# Patient Record
Sex: Male | Born: 1940 | Race: White | Hispanic: No | Marital: Married | Smoking: Former smoker
Health system: Southern US, Community
[De-identification: ages and names within clinical notes are randomized; demographics above are authoritative.]

## PROBLEM LIST (undated history)

## (undated) DIAGNOSIS — I10 Essential (primary) hypertension: Secondary | ICD-10-CM

---

## 2020-09-05 ENCOUNTER — Other Ambulatory Visit: Payer: Self-pay

## 2020-09-05 ENCOUNTER — Encounter: Payer: Self-pay | Admitting: Emergency Medicine

## 2020-09-05 ENCOUNTER — Ambulatory Visit (INDEPENDENT_AMBULATORY_CARE_PROVIDER_SITE_OTHER): Payer: No Typology Code available for payment source | Admitting: Emergency Medicine

## 2020-09-05 VITALS — BP 112/72 | HR 68 | Temp 97.7°F | Ht 72.0 in | Wt 210.0 lb

## 2020-09-05 DIAGNOSIS — R9389 Abnormal findings on diagnostic imaging of other specified body structures: Secondary | ICD-10-CM | POA: Diagnosis not present

## 2020-09-05 DIAGNOSIS — R911 Solitary pulmonary nodule: Secondary | ICD-10-CM

## 2020-09-05 NOTE — Patient Instructions (Signed)
We will repeat your CT scan of the chest in August 2022 to follow for stability or interval change compared with your [facility]. Follow Dr. Delton Coombes in August after your CT scan so that we can review the results together.  Depending on the results we will decide what work-up needs to take place next.

## 2020-09-05 NOTE — Assessment & Plan Note (Signed)
He has scattered pulmonary nodules that are stable by serial CT chest over a 1 year interval, lived in the [facility], question histo although no calcification.  There is an evolving mid right lower lobe opacity, now more nodular and 8 mm that has evolved over multiple CTs.  Discussed possible causes with him today.  His tobacco exposure is minimal but he may have been exposed to asbestos.  Does not describe any significant family history.  For now we will plan to repeat his CT chest in 6 months to follow for interval stability, August 2022.  If enlarging then we will expand work-up, consider PET scan versus navigational bronchoscopy.  If we believe he is a candidate for primary resection we will obtain pulmonary function testing

## 2020-09-05 NOTE — Progress Notes (Signed)
Subjective:    Patient ID: Justin Myers, male    DOB: 02/22/1941, 80 y.o.   MRN: [id number]  HPI 80 year old man, smoker (10 pack years) with a history of hypertension, hyperlipidemia, gout, prostate cancer s/p TURP less than a year ago, glaucoma.  He is referred today from the [place] for an abnormal CT scan of the chest  He apparently had a CT abdomen for gallstones 2 yrs ago, found a RLL lesion that has been followed with serial imaging.   [facility] CT scans of the chest available dated.  Reviewed by me.  This shows scattered pulmonary nodules including an anterior right lower lobe nodule that is 4 x 6.6 mm, stable.  There is an area of slightly dilated right lower lobe midlung airway that looks dilated, now has a more solid appearance with nodularity, 8 mm.  This was read as an increasing area of dilated and mucus filled subsegmental bronchus in the peripheral anterior segment of the right lower lobe.  Also noted was an indeterminate right hilar node that was increased in size but was still within normal size limits.    Review of Systems As  Per HPI  PMH: Hypertension Hyperlipidemia Prostate cancer Gout Glaucoma  No family history on file.   Social History   Socioeconomic History  . Marital status: Married    Spouse name: Not on file  . Number of children: Not on file  . Years of education: Not on file  . Highest education level: Not on file  Occupational History  . Not on file  Tobacco Use  . Smoking status: Current Every Day Smoker    Packs/day: 0.50    Years: 9.00    Pack years: 4.50    Types: Cigarettes  . Smokeless tobacco: Never Used  Substance and Sexual Activity  . Alcohol use: Not on file  . Drug use: Not on file  . Sexual activity: Not on file  Other Topics Concern  . Not on file  Social History Narrative  . 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 allopurinol (ZYLOPRIM) 300 MG tablet Take 0.5 tablets by mouth daily.    Marland Kitchen amLODipine (NORVASC) 5 MG tablet TAKE ONE TABLET BY MOUTH DAILY FOR BLOOD PRESSURE.    Marland Kitchen ascorbic acid (VITAMIN C) 100 MG tablet Take by mouth.    Marland Kitchen aspirin 81 MG EC tablet Take by mouth.    . Brinzolamide-Brimonidine 1-0.2 % SUSP INSTILL 1 DROP IN LEFT EYE THREE TIMES A DAY    . latanoprost (XALATAN) 0.005 % ophthalmic solution INSTILL 1 DROP IN BOTH EYES AT BEDTIME    . lisinopril (ZESTRIL) 40 MG tablet TAKE ONE TABLET BY MOUTH DAILY FOR BLOOD PRESSURE    . pravastatin (PRAVACHOL) 10 MG tablet TAKE ONE TABLET BY MOUTH AT BEDTIME FOR CHOLESTEROL     No facility-administered medications prior to visit.         Objective:   Physical  Exam Vitals:   09/05/20 1059  BP: 112/72  Pulse: 68  Temp: 97.7 F (36.5 C)  TempSrc: Temporal  SpO2: 97%  Weight: 210 lb (95.3 kg)  Height: 6' (1.829 m)   Gen: Pleasant, well-nourished, in no distress,  normal affect  ENT: No lesions,  mouth clear,  oropharynx clear, no postnasal drip  Neck: No JVD, no stridor  Lungs: No use of accessory muscles, no crackles or wheezing on normal respiration, no wheeze on forced expiration  Cardiovascular: RRR, heart sounds normal, no murmur or gallops, no peripheral edema  Musculoskeletal: No deformities, no cyanosis or clubbing  Neuro: alert, awake, non focal  Skin: Warm, no lesions or rash      Assessment & Plan:  Abnormal CT of the chest He has scattered pulmonary nodules that are stable by serial CT chest over a 1 year  interval, lived in the [facility], question histo although no calcification.  There is an evolving mid right lower lobe opacity, now more nodular and 8 mm that has evolved over multiple CTs.  Discussed possible causes with him today.  His tobacco exposure is minimal but he may have been exposed to asbestos.  Does not describe any significant family history.  For now we will plan to repeat his CT chest in 6 months to follow for interval stability, August 2022.  If enlarging then we will expand work-up, consider PET scan versus navigational bronchoscopy.  If we believe he is a candidate for primary resection we will obtain pulmonary function testing  Levy Pupa, MD, PhD 09/05/2020, 1:29 PM [facility] [phone] or if no answer before 7:00PM call [phone] For any issues after 7:00PM please call eLink [phone]

## 2020-09-14 ENCOUNTER — Telehealth: Payer: Self-pay | Admitting: Emergency Medicine

## 2020-09-14 NOTE — Telephone Encounter (Signed)
Called Justin Myers but she did not answer. Left her a detailed message stating that I would fax a copy of the OV notes from 03/22 to her. OV notes have been faxed.   Nothing further needed at time of call.

## 2021-01-23 ENCOUNTER — Ambulatory Visit (HOSPITAL_COMMUNITY)
Admission: RE | Admit: 2021-01-23 | Discharge: 2021-01-23 | Disposition: A | Discharge status: Home / Self Care | Payer: No Typology Code available for payment source | Source: Ambulatory Visit | Attending: Emergency Medicine | Admitting: Emergency Medicine

## 2021-01-23 ENCOUNTER — Other Ambulatory Visit: Payer: Self-pay

## 2021-01-23 DIAGNOSIS — R911 Solitary pulmonary nodule: Secondary | ICD-10-CM

## 2021-01-25 ENCOUNTER — Ambulatory Visit (INDEPENDENT_AMBULATORY_CARE_PROVIDER_SITE_OTHER): Payer: No Typology Code available for payment source | Admitting: Emergency Medicine

## 2021-01-25 ENCOUNTER — Other Ambulatory Visit: Payer: Self-pay

## 2021-01-25 ENCOUNTER — Encounter: Payer: Self-pay | Admitting: Emergency Medicine

## 2021-01-25 DIAGNOSIS — R9389 Abnormal findings on diagnostic imaging of other specified body structures: Secondary | ICD-10-CM

## 2021-01-25 NOTE — Patient Instructions (Signed)
Your CT scan of the chest was reviewed today.  Your nodules are stable compared with your previous scan at the [place].  This is good news. We will plan to repeat your CT scan of the chest without contrast in February. Follow Dr. Delton Coombes and February after your CT so that we can review the results together

## 2021-01-25 NOTE — Progress Notes (Signed)
Subjective:    Patient ID: Justin Myers, male    DOB: 01/21/1941, 80 y.o.   MRN: [id number]  HPI 80 year old man, smoker (10 pack years) with a history of hypertension, hyperlipidemia, gout, prostate cancer s/p TURP less than a year ago, glaucoma.  He is referred today from the [place] for an abnormal CT scan of the chest  He apparently had a CT abdomen for gallstones 2 yrs ago, found a RLL lesion that has been followed with serial imaging.   [facility] CT scans of the chest available dated.  Reviewed by me.  This shows scattered pulmonary nodules including an anterior right lower lobe nodule that is 4 x 6.6 mm, stable.  There is an area of slightly dilated right lower lobe midlung airway that looks dilated, now has a more solid appearance with nodularity, 8 mm.  This was read as an increasing area of dilated and mucus filled subsegmental bronchus in the peripheral anterior segment of the right lower lobe.  Also noted was an indeterminate right hilar node that was increased in size but was still within normal size limits.   ROV 01/25/21 --this a follow-up visit for 80 year old gentleman with a minimal tobacco history, hypertension, hyperlipidemia, prostate cancer (TURP).  He is followed at the [place].  He has a right lower lobe anterior 4 x 6.6 nodule, area of slightly dilated right lower lobe midlung airway that is becoming more solid and nodular, 8 mm.  CT scan of the chest performed 01/23/2021 reviewed by me shows a right lower lobe perifissural submillimeter nodule, clustered peribronchovascular nodularity right lower lobe slight tree-in-bud confirmation.  No significant interval change from most recent CT scan from the [place].   Review of Systems As  Per HPI  PMH: Hypertension Hyperlipidemia Prostate cancer Gout Glaucoma  No family history on file.   Social History   Socioeconomic History   Marital status: Married    Spouse name: Not on file   Number of children: Not on file   Years of education:  Not on file   Highest education level: Not on file  Occupational History   Not on file  Tobacco Use   Smoking status: Former    Packs/day: 0.25    Years: 50.00    Pack years: 12.50    Types: Cigarettes   Smokeless tobacco: Never  Substance and Sexual Activity   Alcohol use: Not on file   Drug use: Not on file   Sexual activity: Not on file  Other Topics Concern   Not on file  Social History Narrative   Not on file   Social Determinants of Health   Financial Resource Strain: Not on file  Food Insecurity: Not on file  Transportation Needs: Not on file  Physical Activity: Not on file  Stress: Not on file  Social Connections: Not on file  Intimate Partner Violence: Not on file    Was in the [employer] in [place], 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     amLODipine (NORVASC) 5 MG tablet TAKE ONE TABLET BY MOUTH DAILY FOR BLOOD PRESSURE.     ascorbic acid (VITAMIN C) 100 MG tablet Take by mouth.     aspirin 81 MG EC tablet Take by mouth.  Brinzolamide-Brimonidine 1-0.2 % SUSP INSTILL 1 DROP IN LEFT EYE THREE TIMES A DAY     latanoprost (XALATAN) 0.005 % ophthalmic solution INSTILL 1 DROP IN BOTH EYES AT BEDTIME     lisinopril (ZESTRIL) 40 MG tablet TAKE ONE TABLET BY MOUTH DAILY FOR BLOOD PRESSURE     pravastatin (PRAVACHOL) 10 MG tablet TAKE ONE TABLET BY MOUTH AT BEDTIME FOR CHOLESTEROL     No facility-administered medications prior to visit.         Objective:   Physical Exam Vitals:   01/25/21 1333  BP: (!) 158/84  Pulse: (!) 57  Temp: 98.1 F (36.7 C)  TempSrc: Oral  SpO2: 97%  Weight: 212 lb 6.4 oz (96.3 kg)  Height: 6' (1.829 m)   Gen: Pleasant, well-nourished, in no distress,  normal  affect  ENT: No lesions,  mouth clear,  oropharynx clear, no postnasal drip  Neck: No JVD, no stridor  Lungs: No use of accessory muscles, no crackles or wheezing on normal respiration, no wheeze on forced expiration  Cardiovascular: RRR, heart sounds normal, no murmur or gallops, no peripheral edema  Musculoskeletal: No deformities, no cyanosis or clubbing  Neuro: alert, awake, non focal  Skin: Warm, no lesions or rash      Assessment & Plan:  Abnormal CT of the chest Right lower lobe somewhat micronodular tree-in-bud nodule less well seen on this scan.  The other nodules are all stable compared with his most recent scan at the [place].  He needs serial follow-up to ensure no evolving changes.  Next scan to be done in 6 months.  If there is any enlargement or suspicion for possible malignancy then we will arrange for diagnostics.  Your CT scan of the chest was reviewed today.  Your nodules are stable compared with your previous scan at the [place].  This is good news. We will plan to repeat your CT scan of the chest without contrast in February. Follow Dr. Delton Coombes and February after your CT so that we can review the results together  Levy Pupa, MD, PhD 01/25/2021, 1:54 PM [facility] [phone] or if no answer before 7:00PM call [phone] For any issues after 7:00PM please call eLink [phone]

## 2021-01-25 NOTE — Assessment & Plan Note (Signed)
Right lower lobe somewhat micronodular tree-in-bud nodule less well seen on this scan.  The other nodules are all stable compared with his most recent scan at the [place].  He needs serial follow-up to ensure no evolving changes.  Next scan to be done in 6 months.  If there is any enlargement or suspicion for possible malignancy then we will arrange for diagnostics.  Your CT scan of the chest was reviewed today.  Your nodules are stable compared with your previous scan at the [place].  This is good news. We will plan to repeat your CT scan of the chest without contrast in February. Follow Dr. Delton Coombes and February after your CT so that we can review the results together

## 2021-01-25 NOTE — Addendum Note (Signed)
Addended by: Dorisann Frames R on: 01/25/2021 03:56 PM   Modules accepted: Orders

## 2021-06-25 ENCOUNTER — Ambulatory Visit (HOSPITAL_COMMUNITY): Admission: RE | Admit: 2021-06-25 | Payer: No Typology Code available for payment source | Source: Ambulatory Visit

## 2021-07-03 ENCOUNTER — Ambulatory Visit (HOSPITAL_COMMUNITY): Payer: No Typology Code available for payment source

## 2021-07-10 ENCOUNTER — Ambulatory Visit (HOSPITAL_COMMUNITY): Admission: RE | Admit: 2021-07-10 | Payer: No Typology Code available for payment source | Source: Ambulatory Visit

## 2021-07-17 ENCOUNTER — Ambulatory Visit (HOSPITAL_COMMUNITY): Payer: No Typology Code available for payment source

## 2021-07-24 ENCOUNTER — Other Ambulatory Visit: Payer: Self-pay

## 2021-07-24 ENCOUNTER — Ambulatory Visit (HOSPITAL_COMMUNITY)
Admission: RE | Admit: 2021-07-24 | Discharge: 2021-07-24 | Disposition: A | Discharge status: Home / Self Care | Payer: No Typology Code available for payment source | Source: Ambulatory Visit | Attending: Emergency Medicine | Admitting: Emergency Medicine

## 2021-07-24 ENCOUNTER — Encounter (HOSPITAL_COMMUNITY): Payer: Self-pay

## 2021-07-24 DIAGNOSIS — R9389 Abnormal findings on diagnostic imaging of other specified body structures: Secondary | ICD-10-CM | POA: Insufficient documentation

## 2021-07-24 HISTORY — DX: Essential (primary) hypertension: I10

## 2021-10-18 IMAGING — CT CT CHEST SUPER D W/O CM
2 of 4 series · 15 of 36 positions shown, 18 images · non-contrast
Comparison: None.

CLINICAL DATA: Scattered pulmonary nodules.  Preoperative planning.

EXAM:
CT CHEST WITHOUT CONTRAST
TECHNIQUE: Multidetector CT imaging of the chest was performed using thin slice
collimation for electromagnetic bronchoscopy planning purposes,
without intravenous contrast.

[Series 4: coronal · coronal · 0.65mm/px · 3 of 151 slices shown]
[im 31/151  lung]
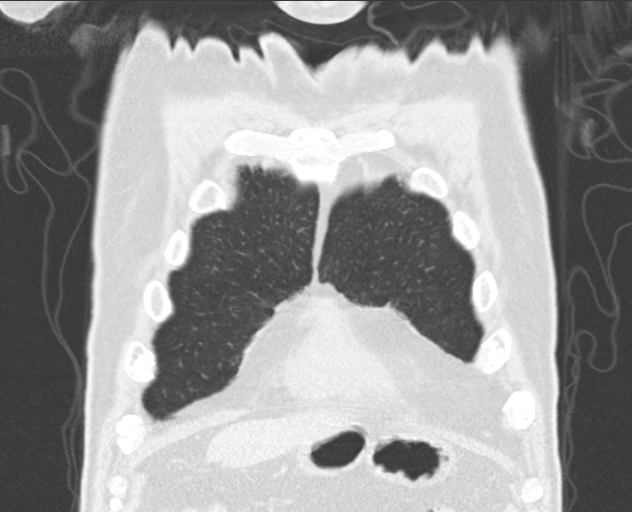
[im 61/151  lung]
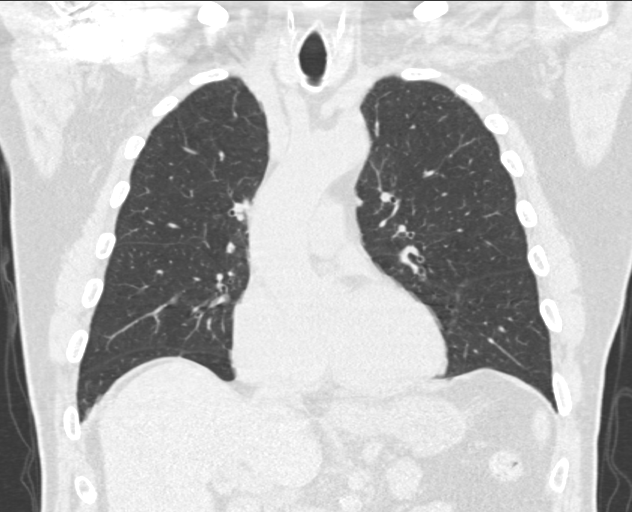
[im 91/151  lung]
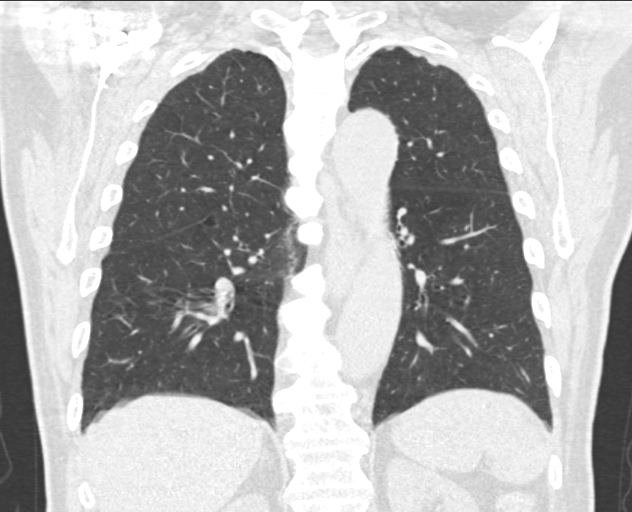

[Series 7: lungs · axial · 0.86mm/px · z∈[+1259,+1557]mm · 12 of 167 slices shown, 15 images]
[im 9/167  mediastinal]
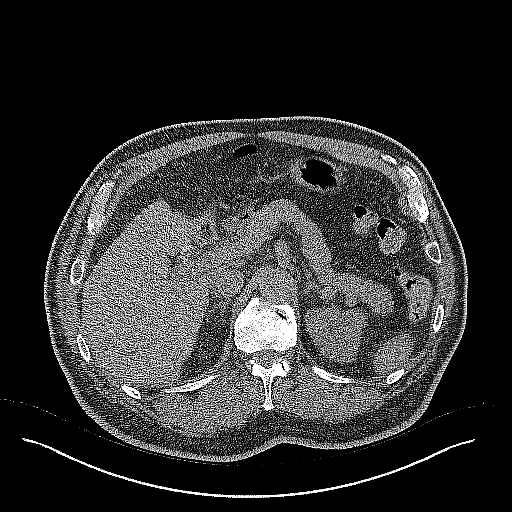
[im 9/167  lung]
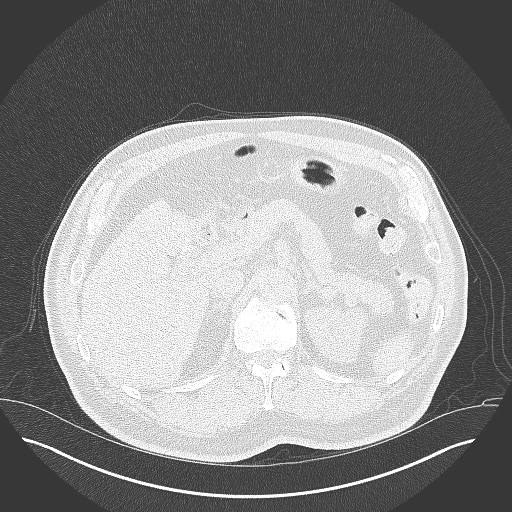
[im 27/167  lung]
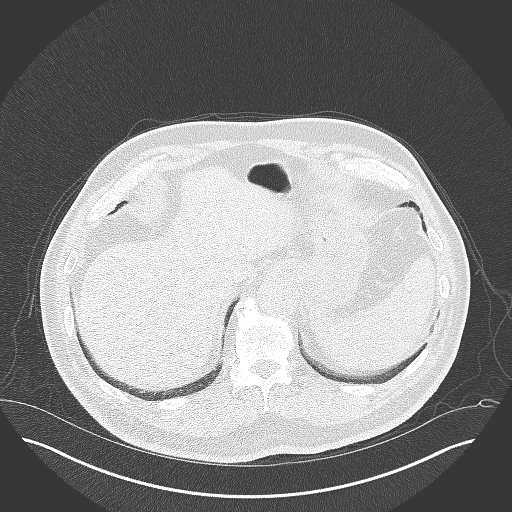
[im 35/167  lung]
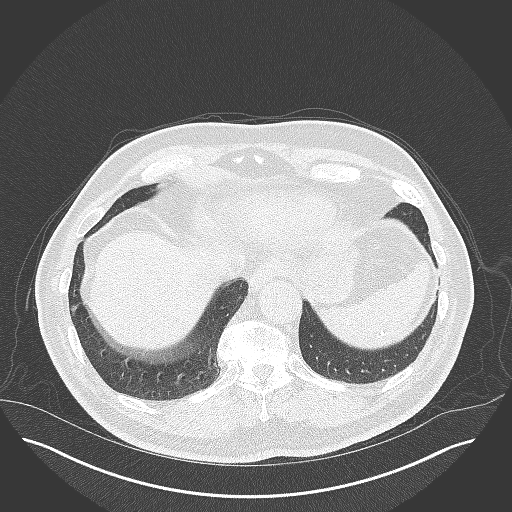
[im 53/167  lung]
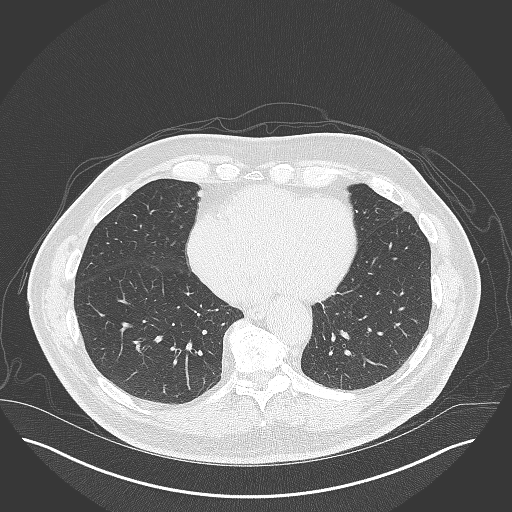
[im 62/167  mediastinal]
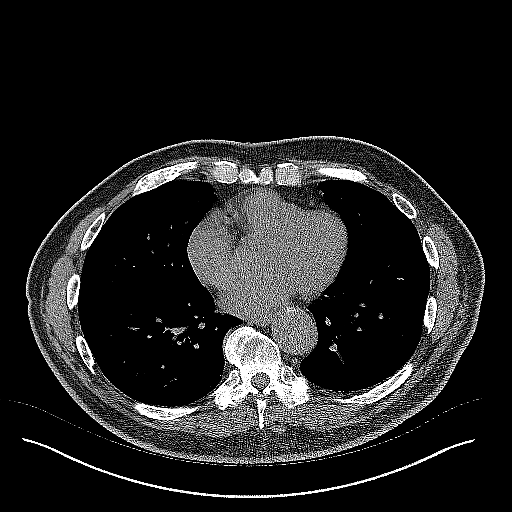
[im 62/167  lung]
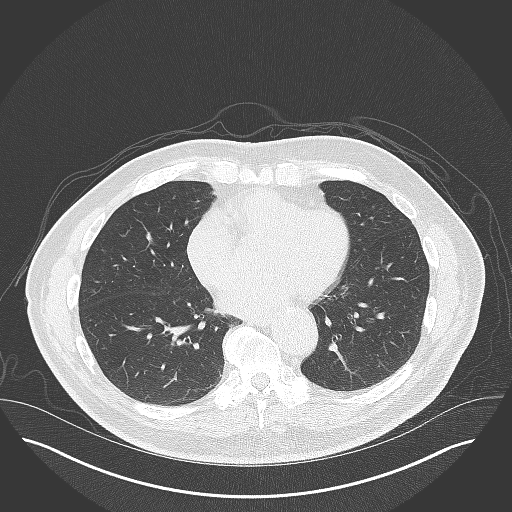
[im 79/167  lung]
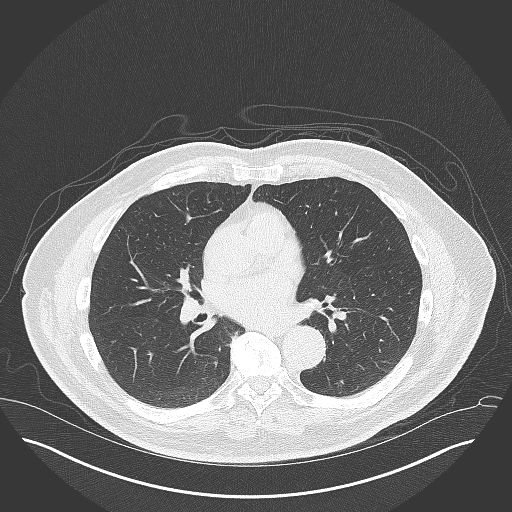
[im 88/167  lung]
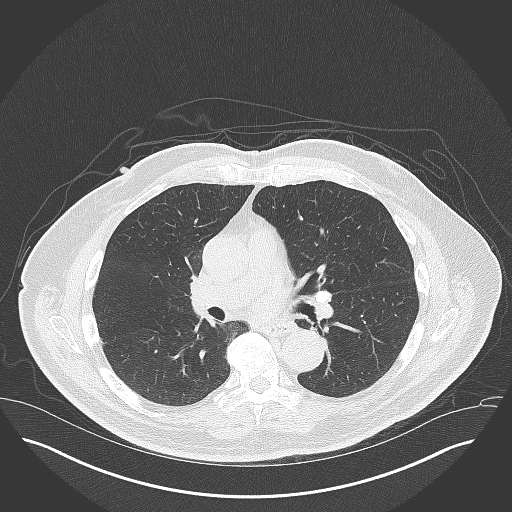
[im 105/167  lung]
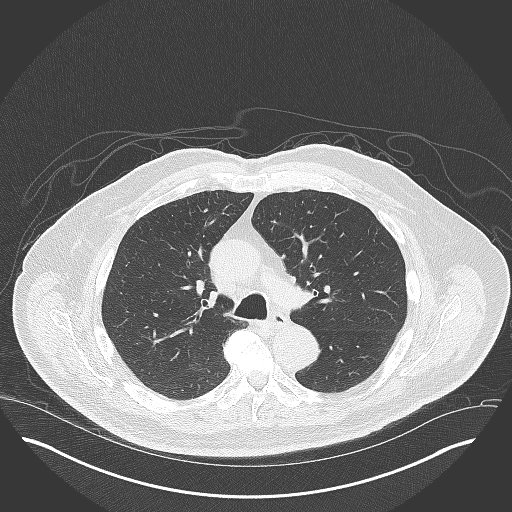
[im 114/167  mediastinal]
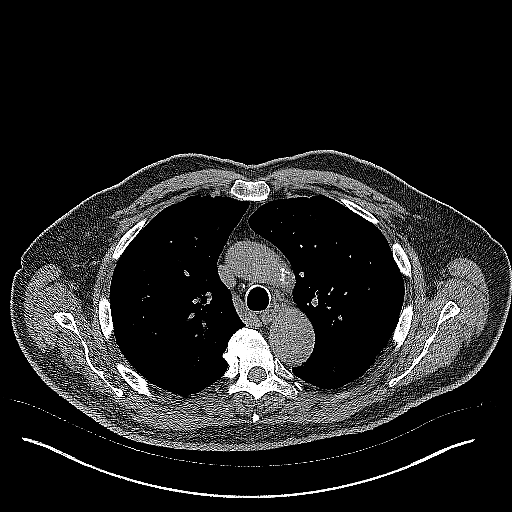
[im 114/167  lung]
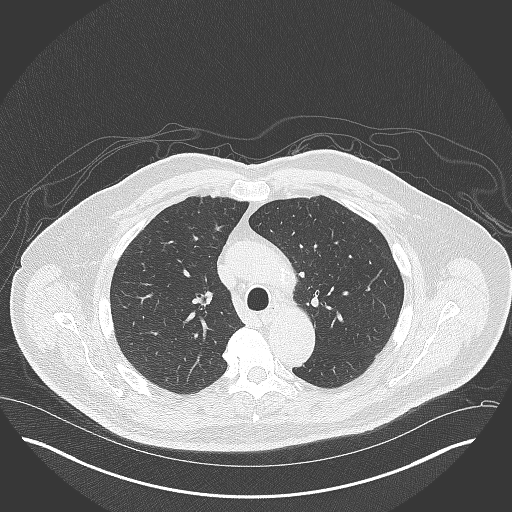
[im 132/167  lung]
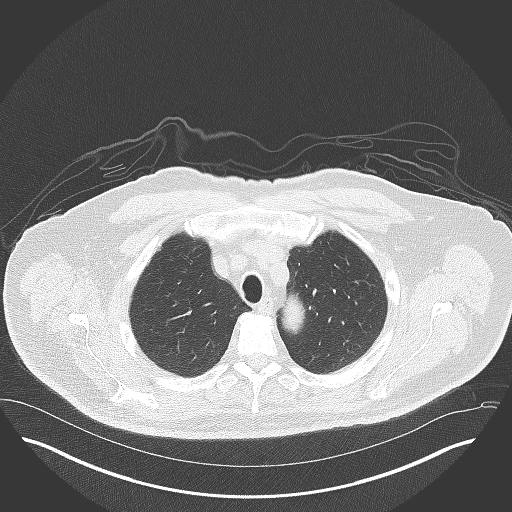
[im 140/167  lung]
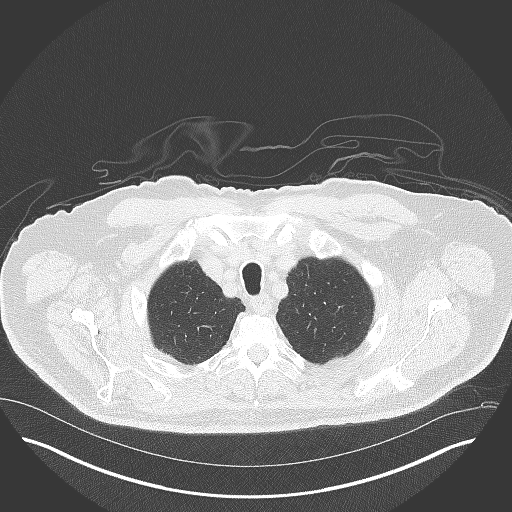
[im 158/167  lung]
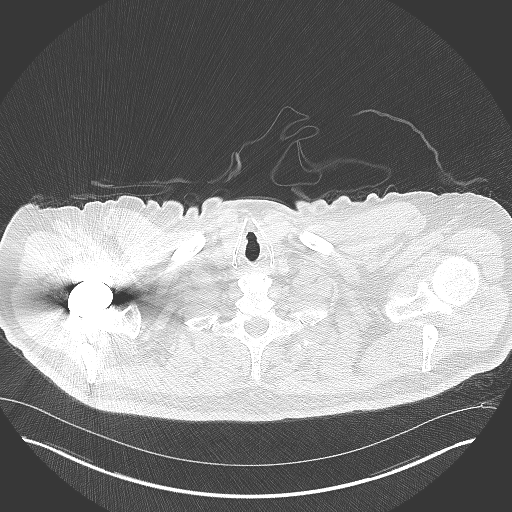

[15 of 36 positions shown; findings below may reference images not displayed]

FINDINGS: Cardiovascular: The heart size is normal. No substantial pericardial
effusion. Coronary artery calcification is evident. Mild
atherosclerotic calcification is noted in the wall of the thoracic
aorta. Distal transverse aorta measures up to 3.9 cm diameter.

Mediastinum/Nodes: No mediastinal lymphadenopathy. Calcified nodal
tissue is seen in the left hilum. The esophagus has normal imaging
features. There is no axillary lymphadenopathy.

Lungs/Pleura: 7 mm perifissural right lower lobe nodule identified
on 119/7. Clustered peribronchovascular nodularity in the right
lower lobe noted on 124/7. Small focus of tree-in-bud nodularity
noted posterior left lower lobe on 86/7. 3 mm left lower lobe
perifissural nodule identified on 82/7. No focal airspace
consolidation. No pleural effusion.

Upper Abdomen: Unremarkable.

Musculoskeletal: No worrisome lytic or sclerotic osseous
abnormality.
IMPRESSION: 1. 7 mm perifissural right lower lobe pulmonary nodule. Non-contrast
chest CT at 6-12 months is recommended. If the nodule is stable at
time of repeat CT, then future CT at 18-24 months (from today's
scan) is considered optional for low-risk patients, but is
recommended for high-risk patients. This recommendation follows the
consensus statement: Guidelines for Management of Incidental
Pulmonary Nodules Detected on CT Images: From the [HOSPITAL]
2. Clustered peribronchovascular nodularity in the right lower lobe
with small focus of tree-in-bud nodularity posterior left lower
lobe. Imaging features compatible with atypical infection.
3. 3.9 cm distal transverse thoracic aortic diameter. Recommend
annual imaging followup by CTA or MRA. This recommendation follows
3555 ACCF/AHA/AATS/ACR/ASA/SCA/CAVE/AMEN/ROTTHIER/DREY Guidelines for the
Diagnosis and Management of Patients with Thoracic Aortic Disease.
Circulation.3555; 121: E266-e369. Aortic aneurysm NOS (HHRB6-N7S.7)
4. Aortic Atherosclerosis (HHRB6-L1M.M).

## 2022-04-18 IMAGING — CT CT CHEST SUPER D W/O CM
2 of 4 series · 15 of 36 positions shown, 18 images · non-contrast
Comparison: Chest CT 01/23/2021.  No other comparison studies.

CLINICAL DATA: Abnormal chest x-ray.  Follow up pulmonary nodule.

EXAM:
CT CHEST WITHOUT CONTRAST
TECHNIQUE: Multidetector CT imaging of the chest was performed using thin slice
collimation for electromagnetic bronchoscopy planning purposes,
without intravenous contrast.
RADIATION DOSE REDUCTION: This exam was performed according to the
departmental dose-optimization program which includes automated
exposure control, adjustment of the mA and/or kV according to
patient size and/or use of iterative reconstruction technique.

[Series 4: coronal · coronal · 0.70mm/px · 3 of 149 slices shown]
[im 30/149  lung]
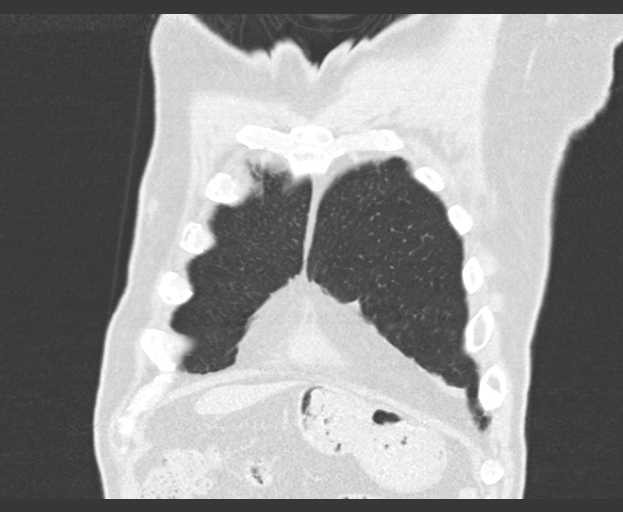
[im 60/149  lung]
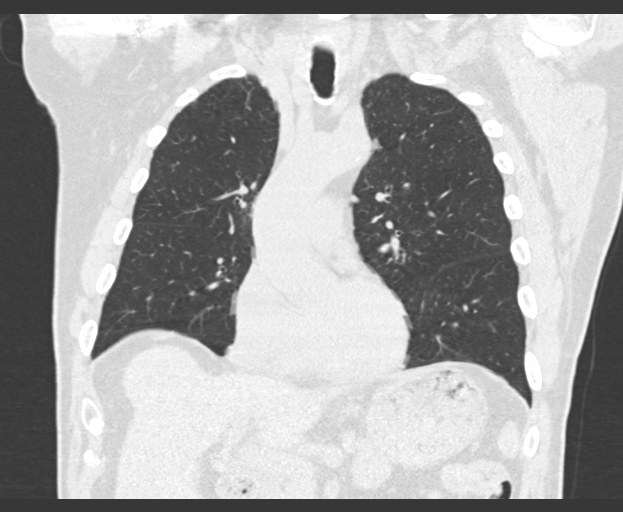
[im 89/149  lung]
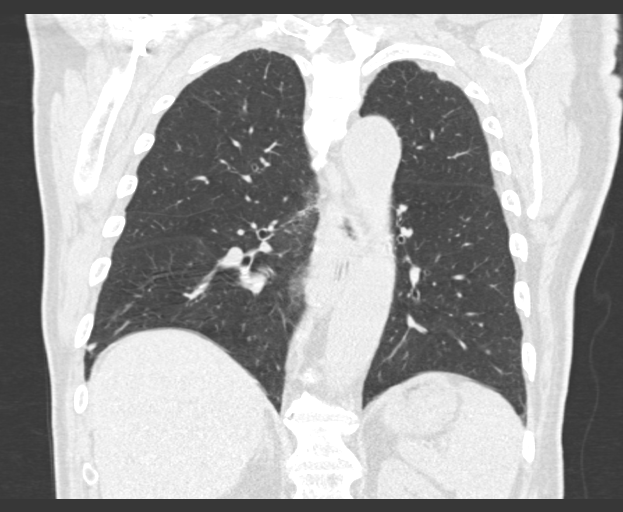

[Series 7: lungs · axial · 0.79mm/px · z∈[-371,-103]mm · 12 of 152 slices shown, 15 images]
[im 9/152  mediastinal]
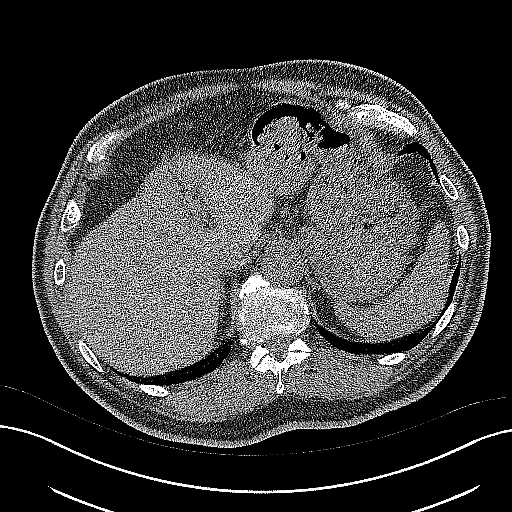
[im 9/152  lung]
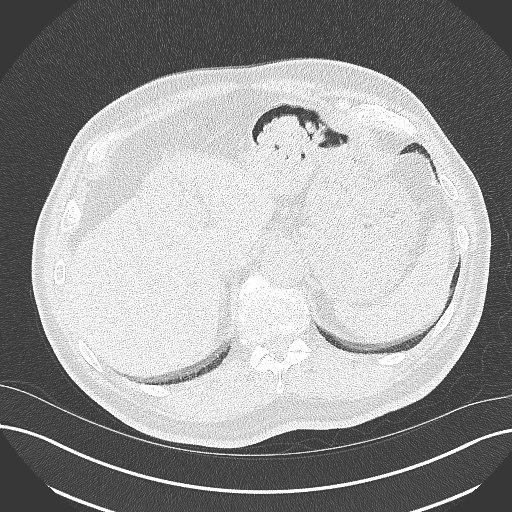
[im 26/152  lung]
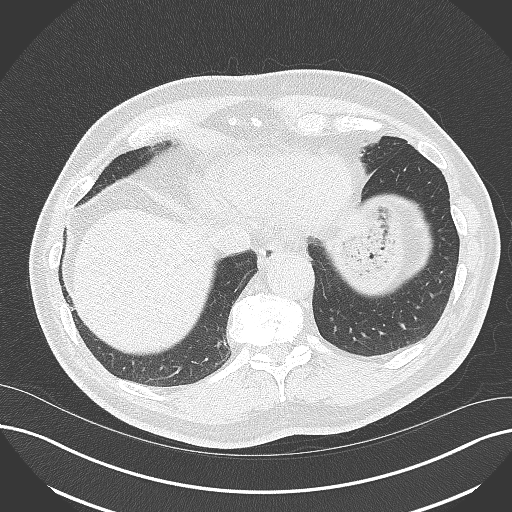
[im 34/152  lung]
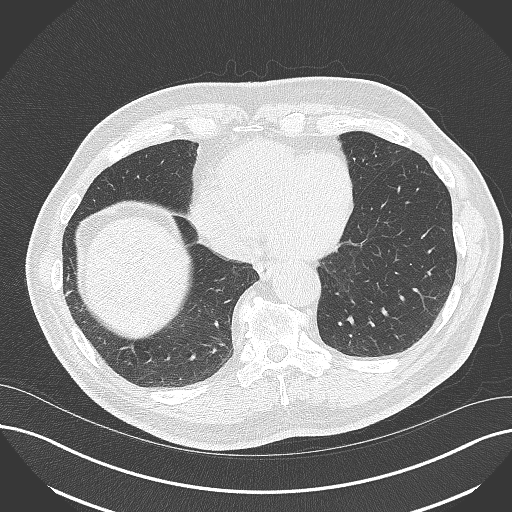
[im 42/152  lung]
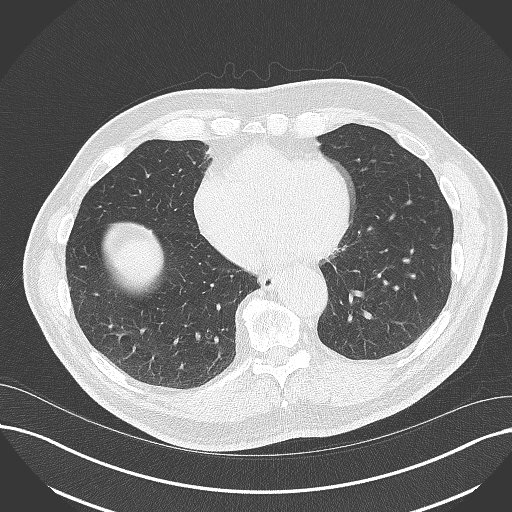
[im 59/152  mediastinal]
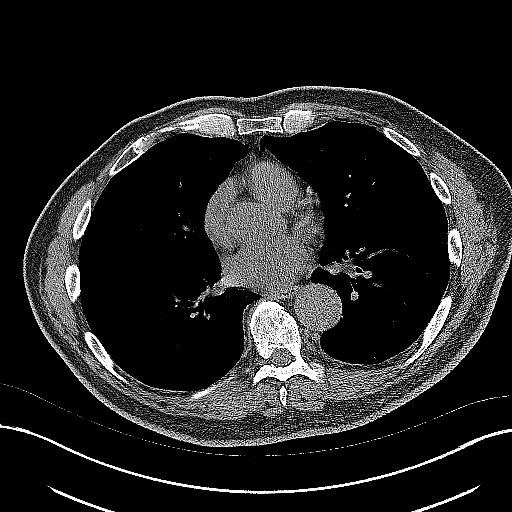
[im 59/152  lung]
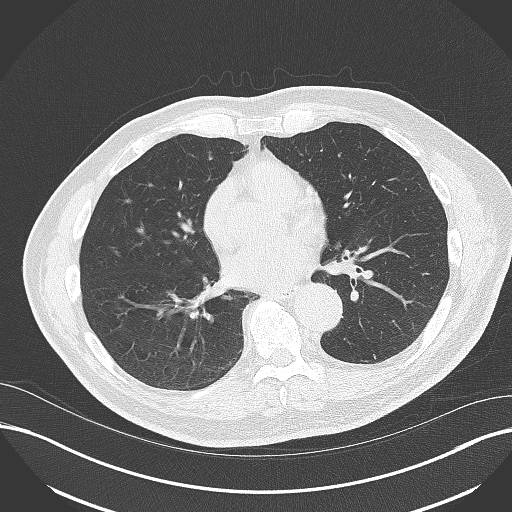
[im 68/152  lung]
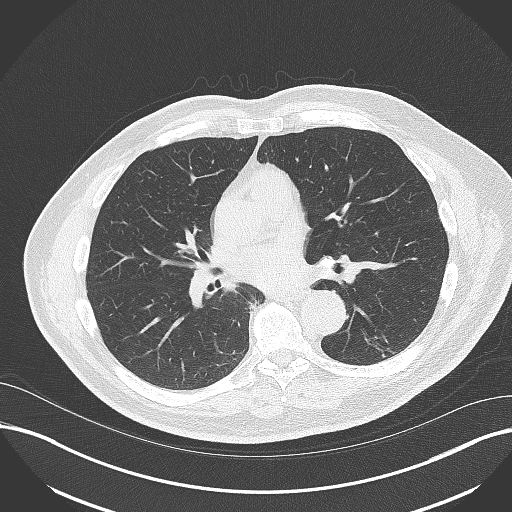
[im 84/152  lung]
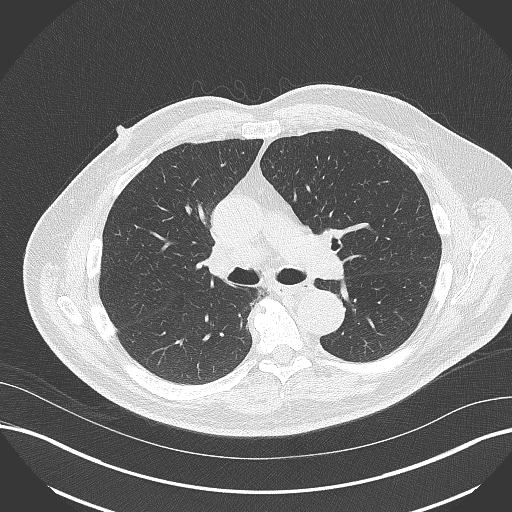
[im 93/152  lung]
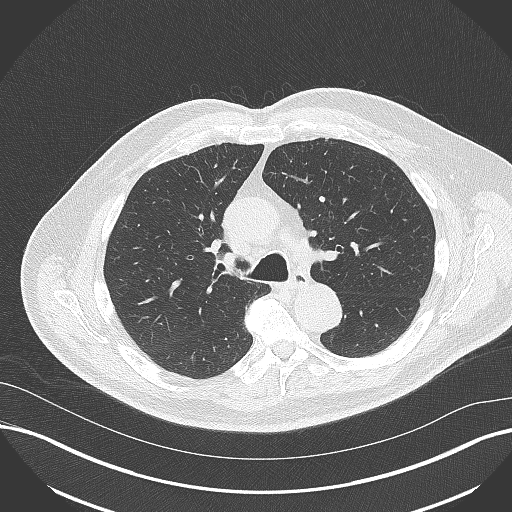
[im 110/152  mediastinal]
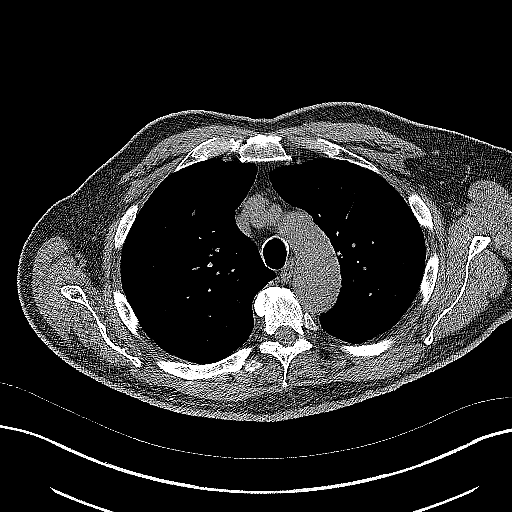
[im 110/152  lung]
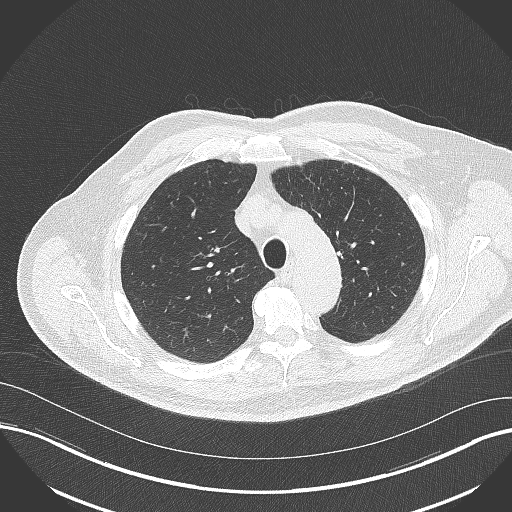
[im 118/152  lung]
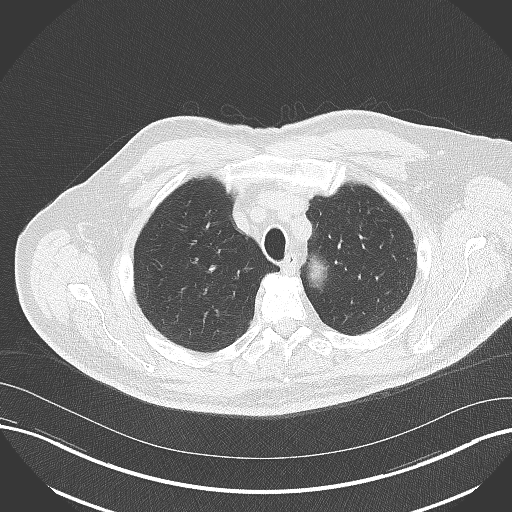
[im 126/152  lung]
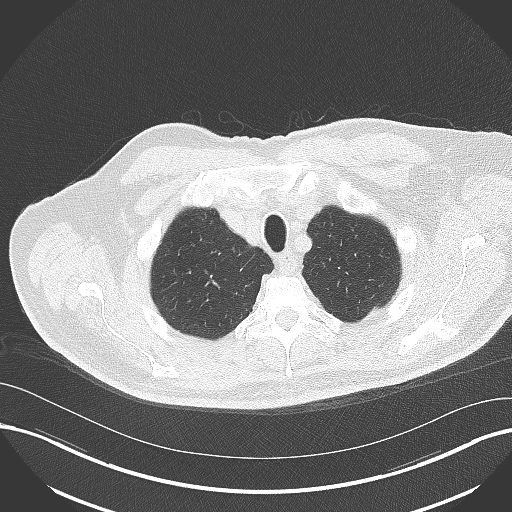
[im 143/152  lung]
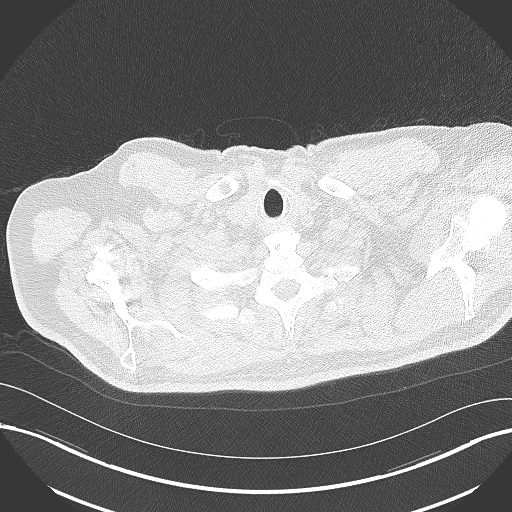

[15 of 36 positions shown; findings below may reference images not displayed]

FINDINGS: Cardiovascular: Mild aortic and coronary artery atherosclerosis. No
acute vascular findings on noncontrast imaging. The heart size is
normal. There is no pericardial effusion.

Mediastinum/Nodes: There are no enlarged mediastinal, hilar or
axillary lymph nodes.There are calcified left hilar lymph nodes. The
thyroid gland, trachea and esophagus demonstrate no significant
findings.

Lungs/Pleura: No pleural effusion or pneumothorax. Mild
centrilobular and paraseptal emphysema with scattered mild central
airway thickening. Scattered small pulmonary nodules are stable,
including the small perifissural and subpleural nodules at the right
lung base on images 106 and 118 series 7. The previously
demonstrated clustered nodularity in the left lower lobe is
unchanged. The clustered nodularity previously seen on the right has
improved. No new or enlarging pulmonary nodules.

Upper abdomen: Scattered calcified granulomas in the spleen. The
visualized upper abdomen otherwise appears unremarkable.

Musculoskeletal/Chest wall: There is no chest wall mass or
suspicious osseous finding. Multilevel thoracic spondylosis.
Previous right shoulder arthroplasty with left shoulder arthropathic
changes.
IMPRESSION: 1. The previously demonstrated scattered small pulmonary nodules are
stable, consistent with benign findings. Per consensus guidelines,
no additional follow-up necessary. This recommendation follows the
consensus statement: Guidelines for Management of Small Pulmonary
Nodules Detected on CT Images: From the [HOSPITAL] 5482;
2. Sequela of prior granulomatous disease.
3. Aortic Atherosclerosis (R4DJH-NGM.M) and Emphysema (R4DJH-JD4.T).
